# Patient Record
Sex: Male | Born: 1980 | Race: White | Hispanic: No | Marital: Single | Smoking: Current some day smoker
Health system: Southern US, Community
[De-identification: ages and names within clinical notes are randomized; demographics above are authoritative.]

## PROBLEM LIST (undated history)

## (undated) HISTORY — PX: SPINAL FUSION: SHX223

---

## 2010-06-02 ENCOUNTER — Emergency Department (HOSPITAL_COMMUNITY): Admission: EM | Admit: 2010-06-02 | Discharge: 2010-06-02 | Payer: Self-pay | Admitting: Emergency Medicine

## 2010-06-11 ENCOUNTER — Emergency Department (HOSPITAL_COMMUNITY): Admission: EM | Admit: 2010-06-11 | Discharge: 2010-06-11 | Payer: Self-pay | Admitting: Family Medicine

## 2011-06-02 ENCOUNTER — Encounter: Payer: Self-pay | Admitting: *Deleted

## 2011-06-02 ENCOUNTER — Emergency Department (INDEPENDENT_AMBULATORY_CARE_PROVIDER_SITE_OTHER): Payer: Medicare Other

## 2011-06-02 ENCOUNTER — Emergency Department (HOSPITAL_BASED_OUTPATIENT_CLINIC_OR_DEPARTMENT_OTHER)
Admission: EM | Admit: 2011-06-02 | Discharge: 2011-06-02 | Disposition: A | Discharge status: Home / Self Care | Payer: Medicare Other | Attending: Emergency Medicine | Admitting: Emergency Medicine

## 2011-06-02 DIAGNOSIS — M542 Cervicalgia: Secondary | ICD-10-CM

## 2011-06-02 DIAGNOSIS — S39012A Strain of muscle, fascia and tendon of lower back, initial encounter: Secondary | ICD-10-CM

## 2011-06-02 DIAGNOSIS — S335XXA Sprain of ligaments of lumbar spine, initial encounter: Secondary | ICD-10-CM | POA: Insufficient documentation

## 2011-06-02 DIAGNOSIS — S161XXA Strain of muscle, fascia and tendon at neck level, initial encounter: Secondary | ICD-10-CM

## 2011-06-02 DIAGNOSIS — S139XXA Sprain of joints and ligaments of unspecified parts of neck, initial encounter: Secondary | ICD-10-CM | POA: Insufficient documentation

## 2011-06-02 DIAGNOSIS — M545 Low back pain: Secondary | ICD-10-CM

## 2011-06-02 DIAGNOSIS — Y92410 Unspecified street and highway as the place of occurrence of the external cause: Secondary | ICD-10-CM | POA: Insufficient documentation

## 2011-06-02 MED ORDER — NAPROXEN 500 MG PO TABS: 500.0000 mg | ORAL_TABLET | Freq: Two times a day (BID) | ORAL | Status: AC

## 2011-06-02 MED ORDER — CYCLOBENZAPRINE HCL 10 MG PO TABS: 10.0000 mg | ORAL_TABLET | Freq: Two times a day (BID) | ORAL | Status: AC | PRN
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
## 2015-03-05 DIAGNOSIS — Z7189 Other specified counseling: Secondary | ICD-10-CM | POA: Diagnosis not present

## 2016-02-24 DIAGNOSIS — Z7251 High risk heterosexual behavior: Secondary | ICD-10-CM | POA: Diagnosis not present

## 2016-02-24 DIAGNOSIS — Z13220 Encounter for screening for lipoid disorders: Secondary | ICD-10-CM | POA: Diagnosis not present

## 2016-02-24 DIAGNOSIS — B354 Tinea corporis: Secondary | ICD-10-CM | POA: Diagnosis not present

## 2016-02-24 DIAGNOSIS — B356 Tinea cruris: Secondary | ICD-10-CM | POA: Diagnosis not present

## 2016-07-04 ENCOUNTER — Encounter: Payer: Self-pay | Admitting: Urgent Care

## 2016-07-04 ENCOUNTER — Ambulatory Visit (INDEPENDENT_AMBULATORY_CARE_PROVIDER_SITE_OTHER): Payer: Medicare Other | Admitting: Urgent Care

## 2016-07-04 ENCOUNTER — Ambulatory Visit (INDEPENDENT_AMBULATORY_CARE_PROVIDER_SITE_OTHER): Payer: Medicare Other

## 2016-07-04 VITALS — BP 120/74 | HR 63 | Temp 97.8°F | Resp 18 | Ht 65.0 in | Wt 183.0 lb

## 2016-07-04 DIAGNOSIS — M6249 Contracture of muscle, multiple sites: Secondary | ICD-10-CM

## 2016-07-04 DIAGNOSIS — M542 Cervicalgia: Secondary | ICD-10-CM

## 2016-07-04 DIAGNOSIS — M62838 Other muscle spasm: Secondary | ICD-10-CM

## 2016-07-04 DIAGNOSIS — M47812 Spondylosis without myelopathy or radiculopathy, cervical region: Secondary | ICD-10-CM | POA: Diagnosis not present

## 2016-07-04 MED ORDER — CYCLOBENZAPRINE HCL 5 MG PO TABS: 5.0000 mg | ORAL_TABLET | Freq: Three times a day (TID) | ORAL | 1 refills | Status: DC | PRN

## 2016-07-04 MED ORDER — NAPROXEN SODIUM 550 MG PO TABS: 550.0000 mg | ORAL_TABLET | Freq: Two times a day (BID) | ORAL | 1 refills | Status: DC

## 2016-07-04 NOTE — Progress Notes (Signed)
    MRN: [id number] DOB: 1981/08/08  Subjective:   Jerry KaufmanRaphael Bell is a 35 y.o. male presenting for chief complaint of Shoulder Pain; Numbness (left hand when patient goes power walking/poss pinched nerve neck); and Wrist Pain (bump on right wrist)  Reports 2 week history of intermittent neck pain. Pain is sharp in nature but has alternated with dull aching sensation, radiates to both sides of his trapezius. Also admits some tingling of his left thumb while exercising, soreness of his left forearm. Has tried ibuprofen with some relief. Denies history of neck surgeries, trauma. Has a history of spinal fusion, completed 34 years ago. Patient works at a [employer], has to lift items ~25 lbs with overhead activity.   Jerry Bell currently has no medications in their medication list. Also has No Known Allergies.  Jerry Bell  has no past medical history on file. Also  has a past surgical history that includes Spinal fusion.  Objective:   Vitals: BP 120/74 (BP Location: Right Arm, Patient Position: Sitting, Cuff Size: Small)   Pulse 63   Temp 97.8 F (36.6 C) (Oral)   Resp 18   Ht 5\' 5"  (1.651 m)   Wt 183 lb (83 kg)   SpO2 98%   BMI 30.45 kg/m   Physical Exam  Constitutional: He is oriented to person, place, and time. He appears well-developed and well-nourished.  Cardiovascular: Normal rate.   Pulmonary/Chest: Effort normal.  Musculoskeletal:       Left wrist: He exhibits normal range of motion, no tenderness (Negative Tinnel test), no bony tenderness, no swelling, no effusion, no crepitus and no deformity.       Cervical back: He exhibits tenderness (during ROM testing) and spasm (bilateral over both sides of trapezius). He exhibits normal range of motion, no bony tenderness, no swelling, no edema, no deformity and no laceration.  Neurological: He is alert and oriented to person, place, and time. He has normal reflexes.   Assessment and Plan :   1. Neck pain 2. Cervical paraspinal  muscle spasm - Will manage conservatively for now. Consider physical therapy, referral to ortho. RTC in 2 weeks if no improvement.  Wallis BambergMario Rubi Tooley, PA-C [facility] [phone] 07/04/2016 9:38 AM   UPDATE - Dg Cervical Spine Complete  Result Date: 07/04/2016 CLINICAL DATA:  Neck pain. EXAM: CERVICAL SPINE - COMPLETE 4+ VIEW COMPARISON:  No recent prior. FINDINGS: Loss of normal cervical lordosis. No evidence of fracture or dislocation. Diffuse degenerative change noted prominent degenerative changes at C6-C7. With straightening of the cervical spine. IMPRESSION: Diffuse degenerative change cervical spine with prominent disc space loss and endplate osteophyte formation C6-C7 associated straightening of the cervical spine is noted. No acute bony abnormality identified. Electronically Signed   By: Maisie Fushomas  Register   On: 07/04/2016 10:10    Will offer patient referral to orthopedist given radiographs. For now, conservative management is appropriate as well given patient's physical exam and pain level.  Wallis BambergMario Ladale Sherburn, PA-C [facility] [phone] 07/04/2016  10:41 AM

## 2016-07-04 NOTE — Patient Instructions (Addendum)
Muscle Cramps and Spasms Muscle cramps and spasms occur when a muscle or muscles tighten and you have no control over this tightening (involuntary muscle contraction). They are a common problem and can develop in any muscle. The most common place is in the calf muscles of the leg. Both muscle cramps and muscle spasms are involuntary muscle contractions, but they also have differences:   Muscle cramps are sporadic and painful. They may last a few seconds to a quarter of an hour. Muscle cramps are often more forceful and last longer than muscle spasms.  Muscle spasms may or may not be painful. They may also last just a few seconds or much longer. CAUSES  It is uncommon for cramps or spasms to be due to a serious underlying problem. In many cases, the cause of cramps or spasms is unknown. Some common causes are:   Overexertion.   Overuse from repetitive motions (doing the same thing over and over).   Remaining in a certain position for a long period of time.   Improper preparation, form, or technique while performing a sport or activity.   Dehydration.   Injury.   Side effects of some medicines.   Abnormally low levels of the salts and ions in your blood (electrolytes), especially potassium and calcium. This could happen if you are taking water pills (diuretics) or you are pregnant.  Some underlying medical problems can make it more likely to develop cramps or spasms. These include, but are not limited to:   Diabetes.   Parkinson disease.   Hormone disorders, such as thyroid problems.   Alcohol abuse.   Diseases specific to muscles, joints, and bones.   Blood vessel disease where not enough blood is getting to the muscles.  HOME CARE INSTRUCTIONS   Stay well hydrated. Drink enough water and fluids to keep your urine clear or pale yellow.  It may be helpful to massage, stretch, and relax the affected muscle.  For tight or tense muscles, use a warm towel, heating  pad, or hot shower water directed to the affected area.  If you are sore or have pain after a cramp or spasm, applying ice to the affected area may relieve discomfort.  Put ice in a plastic bag.  Place a towel between your skin and the bag.  Leave the ice on for 15-20 minutes, 03-04 times a day.  Medicines used to treat a known cause of cramps or spasms may help reduce their frequency or severity. Only take over-the-counter or prescription medicines as directed by your caregiver. SEEK MEDICAL CARE IF:  Your cramps or spasms get more severe, more frequent, or do not improve over time.  MAKE SURE YOU:   Understand these instructions.  Will watch your condition.  Will get help right away if you are not doing well or get worse.   This information is not intended to replace advice given to you by your health care provider. Make sure you discuss any questions you have with your health care provider.   Document Released: 04/15/2002 Document Revised: 02/18/2013 Document Reviewed: 10/10/2012 Elsevier Interactive Patient Education 2016 [employer].     IF you received an x-ray today, you will receive an invoice from [facility]. Please contact [facility] at [phone] with questions or concerns regarding your invoice.   IF you received labwork today, you will receive an invoice from [employer]. Please contact [facility] at [phone] with questions or concerns regarding your invoice.   Our billing staff  will not be able to assist you with questions regarding bills from these companies.  You will be contacted with the lab results as soon as they are available. The fastest way to get your results is to activate your My Chart account. Instructions are located on the last page of this paperwork. If you have not heard from [place] regarding the results in 2 weeks, please contact this office.

## 2016-10-04 ENCOUNTER — Ambulatory Visit (INDEPENDENT_AMBULATORY_CARE_PROVIDER_SITE_OTHER): Payer: Medicare Other | Admitting: Physician Assistant

## 2016-10-04 VITALS — BP 124/80 | HR 75 | Temp 98.4°F | Resp 17 | Ht 65.5 in | Wt 198.0 lb

## 2016-10-04 DIAGNOSIS — Z131 Encounter for screening for diabetes mellitus: Secondary | ICD-10-CM

## 2016-10-04 DIAGNOSIS — N529 Male erectile dysfunction, unspecified: Secondary | ICD-10-CM

## 2016-10-04 DIAGNOSIS — N5319 Other ejaculatory dysfunction: Secondary | ICD-10-CM | POA: Diagnosis not present

## 2016-10-04 DIAGNOSIS — Z8659 Personal history of other mental and behavioral disorders: Secondary | ICD-10-CM | POA: Diagnosis not present

## 2016-10-04 LAB — LIPID PANEL
CHOL/HDL RATIO: 3.4 ratio (ref <5.0)
CHOLESTEROL: 177 mg/dL (ref <200)
HDL: 52 mg/dL (ref >40)
LDL Cholesterol: 108 mg/dL — ABNORMAL HIGH (ref <100)
Triglycerides: 84 mg/dL (ref <150)
VLDL: 17 mg/dL (ref <30)

## 2016-10-04 NOTE — Patient Instructions (Signed)
     IF you received an x-ray today, you will receive an invoice from [facility]. Please contact [facility] at [phone] with questions or concerns regarding your invoice.   IF you received labwork today, you will receive an invoice from [facility]. Please contact [facility] at [phone] with questions or concerns regarding your invoice.   Our billing staff will not be able to assist you with questions regarding bills from these companies.  You will be contacted with the lab results as soon as they are available. The fastest way to get your results is to activate your My Chart account. Instructions are located on the last page of this paperwork. If you have not heard from us regarding the results in 2 weeks, please contact this office.      
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
## 2016-10-05 LAB — TESTOSTERONE TOTAL,FREE,BIO, MALES
Albumin: 4.6 g/dL (ref 3.6–5.1)
SEX HORMONE BINDING: 23 nmol/L (ref 10–50)
TESTOSTERONE FREE: 47.1 pg/mL (ref 46.0–224.0)
TESTOSTERONE: 285 ng/dL (ref 250–827)
Testosterone, Bioavailable: 98.9 ng/dL — ABNORMAL LOW (ref 110.0–575.0)

## 2016-10-05 LAB — HEMOGLOBIN A1C
HEMOGLOBIN A1C: 5.1 % (ref <5.7)
MEAN PLASMA GLUCOSE: 100 mg/dL

## 2016-10-06 NOTE — Progress Notes (Signed)
Please send a letter.  Testosterone mildly low and this may be due to normal daily variation in hormone secretion.  Negative for diabetes and dyslipidemia.  I think this is primarily a psychiatric issue as he is able to maintain an erection and ejaculate.  Therapy first, and if this fails to help the problem will rerun the testosterone.

## 2017-01-07 DIAGNOSIS — Z0130 Encounter for examination of blood pressure without abnormal findings: Secondary | ICD-10-CM | POA: Diagnosis not present

## 2018-01-23 DIAGNOSIS — R7989 Other specified abnormal findings of blood chemistry: Secondary | ICD-10-CM | POA: Diagnosis not present

## 2018-01-23 DIAGNOSIS — Z720 Tobacco use: Secondary | ICD-10-CM | POA: Diagnosis not present

## 2018-01-23 DIAGNOSIS — F17210 Nicotine dependence, cigarettes, uncomplicated: Secondary | ICD-10-CM | POA: Diagnosis not present

## 2018-01-23 DIAGNOSIS — R030 Elevated blood-pressure reading, without diagnosis of hypertension: Secondary | ICD-10-CM | POA: Diagnosis not present

## 2018-01-23 DIAGNOSIS — R634 Abnormal weight loss: Secondary | ICD-10-CM | POA: Diagnosis not present

## 2018-01-23 DIAGNOSIS — E559 Vitamin D deficiency, unspecified: Secondary | ICD-10-CM | POA: Diagnosis not present

## 2018-01-23 DIAGNOSIS — Z13220 Encounter for screening for lipoid disorders: Secondary | ICD-10-CM | POA: Diagnosis not present

## 2018-02-27 DIAGNOSIS — N5082 Scrotal pain: Secondary | ICD-10-CM | POA: Diagnosis not present

## 2018-02-27 DIAGNOSIS — R358 Other polyuria: Secondary | ICD-10-CM | POA: Diagnosis not present

## 2018-03-06 DIAGNOSIS — F840 Autistic disorder: Secondary | ICD-10-CM | POA: Diagnosis not present

## 2018-03-06 DIAGNOSIS — E569 Vitamin deficiency, unspecified: Secondary | ICD-10-CM | POA: Diagnosis not present

## 2018-04-06 DIAGNOSIS — F840 Autistic disorder: Secondary | ICD-10-CM | POA: Diagnosis not present

## 2018-04-06 DIAGNOSIS — E569 Vitamin deficiency, unspecified: Secondary | ICD-10-CM | POA: Diagnosis not present

## 2018-05-31 DIAGNOSIS — N5082 Scrotal pain: Secondary | ICD-10-CM | POA: Diagnosis not present

## 2018-07-02 ENCOUNTER — Emergency Department (HOSPITAL_BASED_OUTPATIENT_CLINIC_OR_DEPARTMENT_OTHER): Payer: Medicare Other

## 2018-07-02 ENCOUNTER — Encounter (HOSPITAL_BASED_OUTPATIENT_CLINIC_OR_DEPARTMENT_OTHER): Payer: Self-pay | Admitting: *Deleted

## 2018-07-02 ENCOUNTER — Other Ambulatory Visit: Payer: Self-pay

## 2018-07-02 ENCOUNTER — Emergency Department (HOSPITAL_BASED_OUTPATIENT_CLINIC_OR_DEPARTMENT_OTHER)
Admission: EM | Admit: 2018-07-02 | Discharge: 2018-07-02 | Disposition: A | Discharge status: Home / Self Care | Payer: Medicare Other | Attending: Emergency Medicine | Admitting: Emergency Medicine

## 2018-07-02 DIAGNOSIS — Y9389 Activity, other specified: Secondary | ICD-10-CM | POA: Insufficient documentation

## 2018-07-02 DIAGNOSIS — M7989 Other specified soft tissue disorders: Secondary | ICD-10-CM | POA: Diagnosis not present

## 2018-07-02 DIAGNOSIS — M25531 Pain in right wrist: Secondary | ICD-10-CM | POA: Diagnosis not present

## 2018-07-02 DIAGNOSIS — S6391XA Sprain of unspecified part of right wrist and hand, initial encounter: Secondary | ICD-10-CM | POA: Diagnosis not present

## 2018-07-02 DIAGNOSIS — S63501A Unspecified sprain of right wrist, initial encounter: Secondary | ICD-10-CM | POA: Diagnosis not present

## 2018-07-02 DIAGNOSIS — S6991XA Unspecified injury of right wrist, hand and finger(s), initial encounter: Secondary | ICD-10-CM | POA: Diagnosis not present

## 2018-07-02 DIAGNOSIS — Y929 Unspecified place or not applicable: Secondary | ICD-10-CM | POA: Insufficient documentation

## 2018-07-02 DIAGNOSIS — F845 Asperger's syndrome: Secondary | ICD-10-CM | POA: Insufficient documentation

## 2018-07-02 DIAGNOSIS — Z720 Tobacco use: Secondary | ICD-10-CM | POA: Diagnosis not present

## 2018-07-02 DIAGNOSIS — Y999 Unspecified external cause status: Secondary | ICD-10-CM | POA: Diagnosis not present

## 2018-07-02 DIAGNOSIS — W01198A Fall on same level from slipping, tripping and stumbling with subsequent striking against other object, initial encounter: Secondary | ICD-10-CM | POA: Insufficient documentation

## 2018-07-02 MED ORDER — IBUPROFEN 600 MG PO TABS: 600.0000 mg | ORAL_TABLET | Freq: Four times a day (QID) | ORAL | 0 refills | Status: AC | PRN

## 2018-07-02 NOTE — Discharge Instructions (Addendum)
Continue to ice and elevate your wrist at home.  Wear the splint provided for at least a week.  Avoid any heavy lifting.  Follow-up with your family doctor as needed.

## 2018-07-02 NOTE — ED Provider Notes (Signed)
[facility] EMERGENCY DEPARTMENT Provider Note   CSN: [id number] Arrival date & time: 07/02/18  1553     History   Chief Complaint Chief Complaint  Patient presents with  . Wrist Injury    HPI Jerry Bell is a 37 y.o. male.  HPI  Jerry Bell is a 37 y.o. male Jerry Bell to emergency department after a fall.  Patient states he slipped on wet floor and fell bracing himself with his right arm.  He reports pain to his right wrist.  Incident occurred earlier this morning.  He did not take any medications prior to coming in.  Did not ice the area.  No other injuries from the fall.  No numbness or weakness distal to the wrist.   History reviewed. No pertinent past medical history.  Patient Active Problem List   Diagnosis Date Noted  . History of Asperger's syndrome 10/04/2016    Past Surgical History:  Procedure Laterality Date  . SPINAL FUSION          Home Medications    Prior to Admission medications   Not on File    Family History No family history on file.  Social History Social History   Tobacco Use  . Smoking status: Current Some Day Smoker  . Smokeless tobacco: Never Used  Substance Use Topics  . Alcohol use: Yes  . Drug use: No     Allergies   Patient has no known allergies.   Review of Systems Review of Systems  Constitutional: Negative for chills and fever.  Musculoskeletal: Positive for arthralgias and joint swelling.  Neurological: Negative for weakness and numbness.  All other systems reviewed and are negative.    Physical Exam Updated Vital Signs BP (!) 151/101 (BP Location: Left Arm)   Pulse 72   Temp 98.6 F (37 C) (Oral)   Resp 18   Ht 5\' 5"  (1.651 m)   Wt 83.9 kg   SpO2 98%   BMI 30.79 kg/m   Physical Exam  Constitutional: He is oriented to person, place, and time. He appears well-developed and well-nourished. No distress.  Eyes: Conjunctivae are normal.  Neck: Neck supple.  Cardiovascular: Normal rate.    Radial pulses are intact and equal bilaterally  Pulmonary/Chest: No respiratory distress.  Abdominal: He exhibits no distension.  Musculoskeletal:  No obvious swelling to the wrist.  There is a ganglion cyst which is nontender to the dorsal wrist.  Patient does have tenderness to palpation over the ulnar aspect of the dorsal wrist joint.  No tenderness over radial joint.  Normal rest of the hand with full range of motion of all fingers.  Grip strength is 5 out of 5 and equal bilaterally.  Neurological: He is alert and oriented to person, place, and time.  Skin: Skin is warm and dry.  Nursing note and vitals reviewed.    ED Treatments / Results  Labs (all labs ordered are listed, but only abnormal results are displayed) Labs Reviewed - No data to display  EKG None  Radiology Dg Wrist Complete Right  Result Date: 07/02/2018 CLINICAL DATA:  Patient slipped and fell at work this morning injuring his right wrist; pain is over distal ulna, wrist and proximal ring and little finger EXAM: RIGHT WRIST - COMPLETE 3+ VIEW COMPARISON:  None. FINDINGS: There is soft tissue swelling along the dorsum of the wrist. No acute fracture or subluxation. No radiopaque foreign body or soft tissue gas. IMPRESSION: Soft tissue swelling.  No acute osseous injury.  Electronically Signed   By: Norva PavlovElizabeth  Brown M.D.   On: 07/02/2018 16:35    Procedures Procedures (including critical care time)  Medications Ordered in ED Medications - No data to display   Initial Impression / Assessment and Plan / ED Course  I have reviewed the triage vital signs and the nursing notes.  Pertinent labs & imaging results that were available during my care of the patient were reviewed by me and considered in my medical decision making (see chart for details).     Patient with right wrist injury.  X-rays negative.  Neurovascular intact.  No tenderness over anatomical snuffbox.  The rest of the hand is normal.  I will place him  in a Velcro splint.  He is requesting a work note stating that he should not be lifting anything heavy for a week.  I will provide that.  Instructed to follow-up with his family doctor in a week if he is not improving.  Vitals:   07/02/18 1606  BP: (!) 151/101  Pulse: 72  Resp: 18  Temp: 98.6 F (37 C)  SpO2: 98%     Final Clinical Impressions(s) / ED Diagnoses   Final diagnoses:  Wrist sprain, right, initial encounter    ED Discharge Orders         Ordered    ibuprofen (ADVIL,MOTRIN) 600 MG tablet  Every 6 hours PRN     07/02/18 1701           Jaynie CrumbleKirichenko, Ja Pistole, PA-C 07/02/18 1702    Vanetta MuldersZackowski, Scott, MD 07/08/18 [phone]

## 2018-07-02 NOTE — ED Triage Notes (Signed)
He slipped and fell this am. Injury to his right wrist. Radial pulse palpated.

## 2018-10-08 IMAGING — CR DG WRIST COMPLETE 3+V*R*
4 series · 4 of 4 positions shown · non-contrast
Comparison: None.

CLINICAL DATA: Patient slipped and fell at work this morning
injuring his right wrist; pain is over distal ulna, wrist and
proximal ring and little finger

EXAM:
RIGHT WRIST - COMPLETE 3+ VIEW

[x wrist pa right]
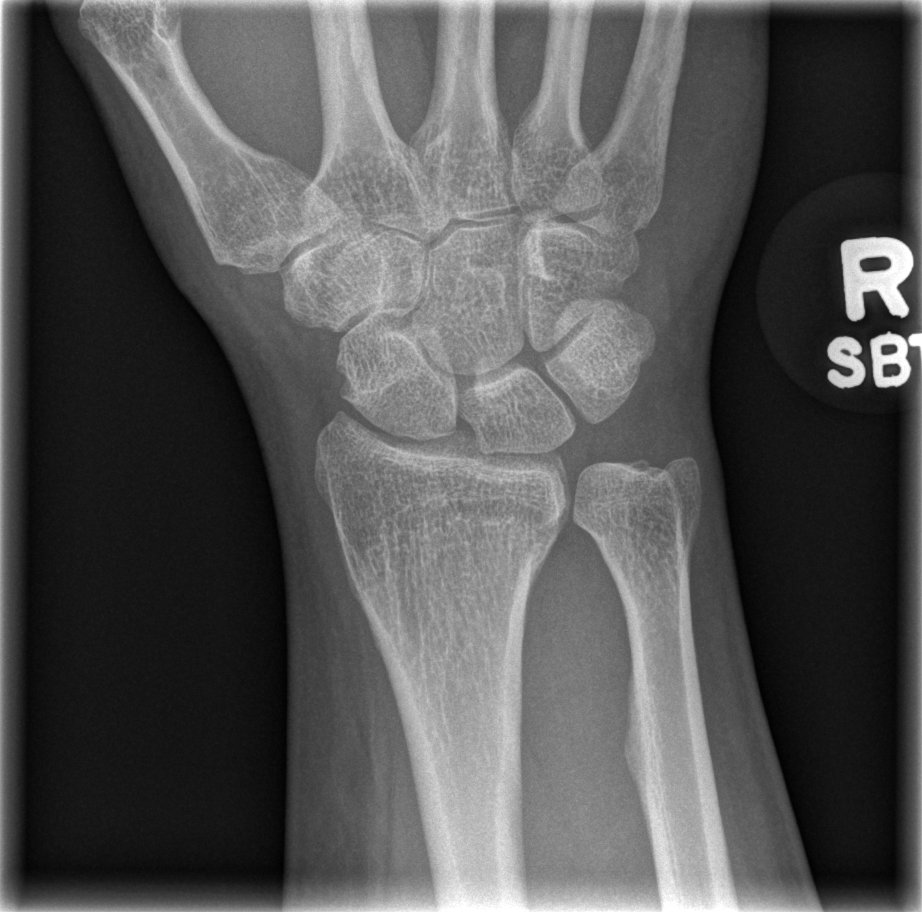

[x wrist obl right]
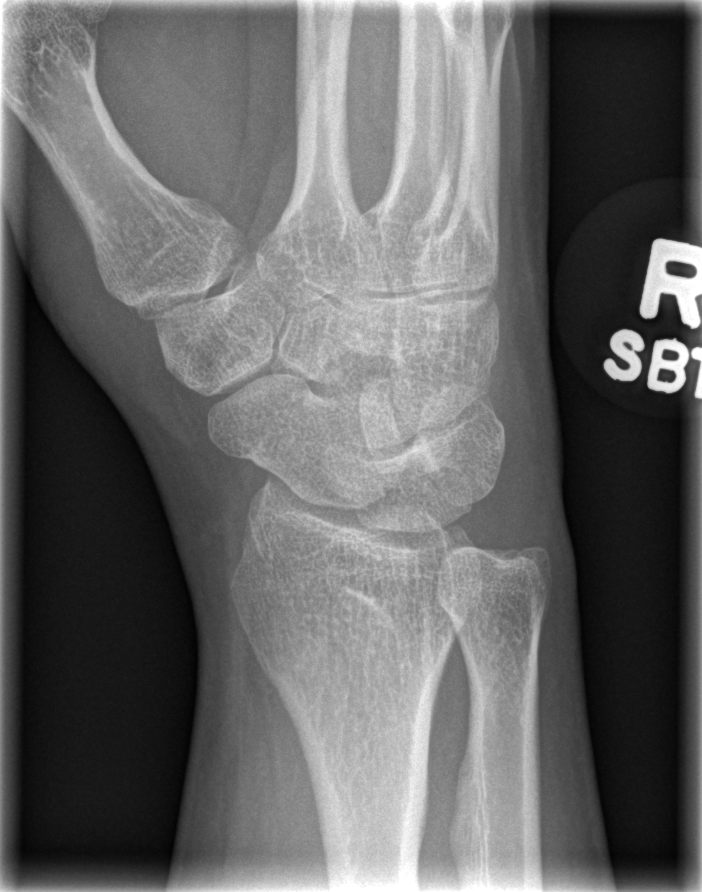

[x wrist lat right]
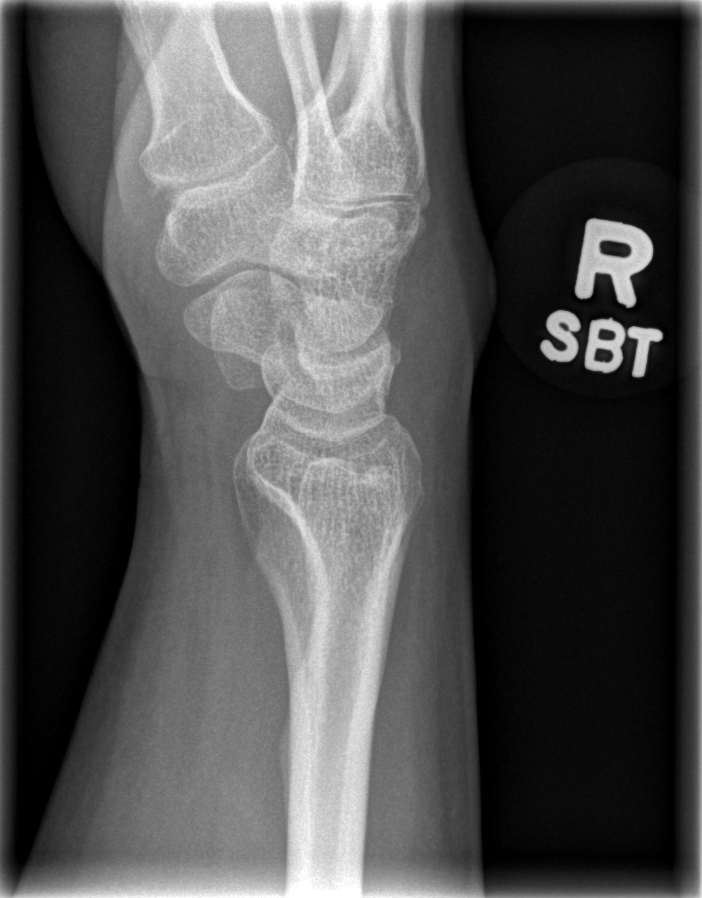

[x navicular]
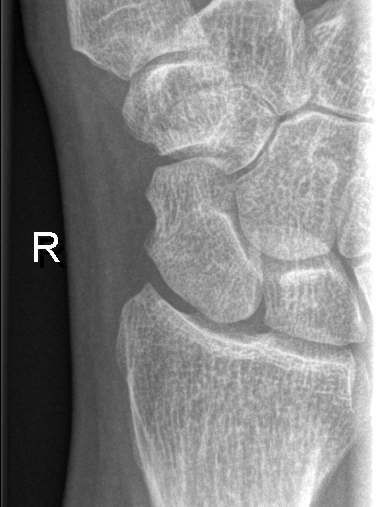

[4 of 4 positions shown; findings below may reference images not displayed]

FINDINGS: There is soft tissue swelling along the dorsum of the wrist. No
acute fracture or subluxation. No radiopaque foreign body or soft
tissue gas.
IMPRESSION: Soft tissue swelling.  No acute osseous injury.
